# Patient Record
Sex: Female | Born: 1965 | Hispanic: Yes | Marital: Married | Smoking: Never smoker
Health system: Southern US, Community
[De-identification: ages and names within clinical notes are randomized; demographics above are authoritative.]

## PROBLEM LIST (undated history)

## (undated) DIAGNOSIS — R51 Headache: Secondary | ICD-10-CM

## (undated) DIAGNOSIS — R519 Headache, unspecified: Secondary | ICD-10-CM

## (undated) DIAGNOSIS — D649 Anemia, unspecified: Secondary | ICD-10-CM

## (undated) DIAGNOSIS — E119 Type 2 diabetes mellitus without complications: Secondary | ICD-10-CM

## (undated) HISTORY — PX: CHOLECYSTECTOMY: SHX55

## (undated) HISTORY — PX: TUBAL LIGATION: SHX77

---

## 2005-06-13 ENCOUNTER — Ambulatory Visit: Payer: Self-pay

## 2005-06-20 ENCOUNTER — Ambulatory Visit: Payer: Self-pay

## 2007-06-30 ENCOUNTER — Ambulatory Visit: Payer: Self-pay

## 2007-09-30 ENCOUNTER — Ambulatory Visit: Payer: Self-pay

## 2008-07-31 ENCOUNTER — Emergency Department: Payer: Self-pay | Admitting: Emergency Medicine

## 2010-10-14 HISTORY — PX: EYELID LACERATION REPAIR: SHX1564

## 2014-12-27 ENCOUNTER — Ambulatory Visit: Payer: Self-pay | Admitting: Physician Assistant

## 2015-02-09 LAB — CBC WITH DIFFERENTIAL/PLATELET
BASOS PCT: 0.3 %
Basophil #: 0 10*3/uL (ref 0.0–0.1)
Eosinophil #: 0.2 10*3/uL (ref 0.0–0.7)
Eosinophil %: 2.4 %
HCT: 41 % (ref 35.0–47.0)
HGB: 13.6 g/dL (ref 12.0–16.0)
LYMPHS PCT: 27.4 %
Lymphocyte #: 2.3 10*3/uL (ref 1.0–3.6)
MCH: 28.9 pg (ref 26.0–34.0)
MCHC: 33.3 g/dL (ref 32.0–36.0)
MCV: 87 fL (ref 80–100)
MONOS PCT: 7.5 %
Monocyte #: 0.6 x10 3/mm (ref 0.2–0.9)
Neutrophil #: 5.2 10*3/uL (ref 1.4–6.5)
Neutrophil %: 62.4 %
Platelet: 259 10*3/uL (ref 150–440)
RBC: 4.72 10*6/uL (ref 3.80–5.20)
RDW: 12.9 % (ref 11.5–14.5)
WBC: 8.3 10*3/uL (ref 3.6–11.0)

## 2015-02-10 ENCOUNTER — Other Ambulatory Visit: Payer: PRIVATE HEALTH INSURANCE

## 2015-02-10 LAB — COMPREHENSIVE METABOLIC PANEL
ALK PHOS: 72 U/L
ALT: 73 U/L — AB
ANION GAP: 7 (ref 7–16)
Albumin: 4.4 g/dL
BUN: 13 mg/dL
Bilirubin,Total: 0.4 mg/dL
Calcium, Total: 9.5 mg/dL
Chloride: 105 mmol/L
Co2: 28 mmol/L
Creatinine: 0.66 mg/dL
EGFR (African American): >60
EGFR (Non-African Amer.): >60
Glucose: 166 mg/dL — ABNORMAL HIGH
POTASSIUM: 3.8 mmol/L
SGOT(AST): 47 U/L — ABNORMAL HIGH
Sodium: 140 mmol/L
TOTAL PROTEIN: 7.9 g/dL

## 2015-02-10 LAB — TROPONIN I

## 2015-02-10 LAB — URINALYSIS, COMPLETE
BILIRUBIN, UR: NEGATIVE
BLOOD: NEGATIVE
Glucose,UR: NEGATIVE mg/dL (ref 0–75)
Ketone: NEGATIVE
NITRITE: NEGATIVE
PROTEIN: NEGATIVE
Ph: 7 (ref 4.5–8.0)
Specific Gravity: 1.013 (ref 1.003–1.030)

## 2015-02-10 LAB — HEMOGLOBIN A1C: Hemoglobin A1C: 7.1 % — ABNORMAL HIGH

## 2015-02-10 LAB — LIPASE, BLOOD: Lipase: 39 U/L

## 2015-02-11 ENCOUNTER — Observation Stay
Admission: RE | Admit: 2015-02-11 | Discharge: 2015-02-11 | Disposition: A | Discharge status: Home / Self Care | Payer: PRIVATE HEALTH INSURANCE | Attending: Surgery | Admitting: Surgery

## 2015-02-11 DIAGNOSIS — K821 Hydrops of gallbladder: Secondary | ICD-10-CM | POA: Diagnosis present

## 2015-02-11 MED ORDER — PROMETHAZINE HCL 25 MG/ML IJ SOLN: 12.5000 mg | INTRAMUSCULAR | Status: DC | PRN

## 2015-02-11 MED ORDER — SODIUM CHLORIDE 0.9 % IJ SOLN: 3.0000 mL | INTRAMUSCULAR | Status: DC | PRN

## 2015-02-11 MED ORDER — ONDANSETRON HCL 4 MG/2ML IJ SOLN: 4.0000 mg | INTRAMUSCULAR | Status: DC | PRN

## 2015-02-11 MED ORDER — MORPHINE SULFATE 2 MG/ML IJ SOLN: 2.0000 mg | INTRAMUSCULAR | Status: DC | PRN

## 2015-02-11 MED ORDER — LACTATED RINGERS IV SOLN
INTRAVENOUS | Status: DC
  Filled 2015-02-11: qty 250

## 2015-02-11 MED ORDER — HYDROCODONE-ACETAMINOPHEN 5-325 MG PO TABS: 1.0000 | ORAL_TABLET | ORAL | Status: DC | PRN

## 2015-02-11 MED ORDER — INSULIN ASPART 100 UNIT/ML ~~LOC~~ SOLN: 0.0000 [IU] | Freq: Three times a day (TID) | SUBCUTANEOUS | Status: DC

## 2015-02-12 NOTE — Op Note (Addendum)
PATIENT NAME:  Sheryl Medina, Sheryl Medina MR#:  [id number] DATE OF BIRTH:  [birth year]  DATE OF PROCEDURE:  02/10/2015  PREOPERATIVE DIAGNOSIS: Acute calculous cholecystitis.   POSTOPERATIVE DIAGNOSIS:   Acute calculous cholecystitis.   PROCEDURE PERFORMED: Laparoscopic cholecystectomy.   SURGEON: Hortencia Conradi, MD   ASSISTANT: None.   ANESTHESIA: General endotracheal.   FINDINGS: Stones lodged in gallbladder neck.   SPECIMENS: Gallbladder with contents to pathology.   ESTIMATED BLOOD LOSS: 25 mL.   DRAINS: None.   COUNTS: Lap and needle count correct x2.   DESCRIPTION OF PROCEDURE: With informed consent in supine position, general endotracheal anesthesia, the patient's abdomen was widely clipped of hair, sterilely prepped and draped with ChloraPrep solution. A timeout was observed. The previous infraumbilical transverse oriented scar was opened with a scalpel and carried down with sharp dissection to the fascia which was incised in the midline and elevated with Kocher clamps. The peritoneum was entered sharply; 0 Vicryl U-stitch was passed on either side of the abdominal midline fascia securing a 12 mm blunt Hassan trocar placed under direct visualization. Pneumoperitoneum was established. The patient was then positioned in reverse Trendelenburg and airplaned right side up. A 5 mm bladeless trocar was placed in the epigastric region, two 5 mm first assistant ports in the right subcostal margin. The gallbladder was tensely distended and aspirated of 40 mL of thin bile with aspiration cannula. It was placed on tension over the liver. Lateral retraction was achieved on Hartmann's pouch. Dissection of the hepatoduodenal ligament utilizing blunt technique and minimal cautery demonstrated a single cystic duct and a cystic artery with a posterior and anterior branch. One small intervening lymphatic vessel was divided between single hemoclips. The cystic duct was triply clipped on the portal side,  singly clipped on the gallbladder side and divided. The cystic artery was divided with its anterior and posterior branches separately. The gallbladder was then retrieved off the gallbladder fossa utilizing hook cautery apparatus, placed into an EndoCatch device and retrieved. Pneumoperitoneum was re-established. Visualization of the umbilical trocar site demonstrated no evidence of visceral injury. Ports were then removed under direct visualization after the right upper quadrant was irrigated with a total of 1.5 liters of warm normal saline and aspirated dry and with hemostasis being obtained in the gallbladder fossa with hook cautery apparatus pneumoperitoneum was then released. The infraumbilical fascial defect was reapproximated with an additional figure-of-eight #0 Vicryl suture in vertical orientation, the existing stay sutures tied to each other. A total of 25 mL of 0.25% plain Marcaine was infiltrated along all skin and fascial incisions prior to closure. Vicryl 4-0 subcuticular was applied to all skin edges followed by benzoin, Steri-Strips, Telfa, and Tegaderm. The patient was then subsequently extubated and taken to the recovery room in stable and satisfactory condition by anesthesia services.    ____________________________ Jeannette How Marina Gravel, MD mab:tr D: 02/10/2015 11:53:30 ET T: 02/10/2015 15:58:12 ET JOB#: [id number]  cc: Elta Guadeloupe A. Marina Gravel, MD, <Dictator> Hortencia Conradi MD ELECTRONICALLY SIGNED 02/11/2015 15:26

## 2015-02-12 NOTE — Consult Note (Signed)
Brief Consult Note: Diagnosis: 1. Hyperglycemia 2. s/p Lap Chole.   Patient was seen by consultant.   Consult note dictated.   Orders entered.   Discussed with Attending MD.   Comments: 49 yo female w/ hx of DM (diet controlled), who came to the hospital due to abdominal pain and suspected acute cholecystitis and is s/p Lap Chole and noted to be hyperglycemic.   1. Hyperglycemia - pt. does have hx of DM but it's diet controlled.  - A1c is mildly elevated at 7.1.  ?? glucose intolerance (vs) DM - cont. SSI for now.  Advised pt. to check sugars 3 times a day and go back to PCP to see if she needs to be on oral meds down the road.  - cont. carb control diet for now.   2. S/p Lap Chole - cont. pain control and care as per surgery.   Thanks for the consult and will sign off and call back if any further help needed.   Job # [id number].  Electronic Signatures: Henreitta Leber (MD)  (Signed 29-Apr-16 14:50)  Authored: Brief Consult Note   Last Updated: 29-Apr-16 14:50 by Henreitta Leber (MD)

## 2015-02-12 NOTE — H&P (Signed)
   Subjective/Chief Complaint Epigastric pain, nausea/vomiting   History of Present Illness 49 yo F who presents with 10 hours of epigastric pain and 8 hours of nausea/vomiting.  Pain began acutely and has worsened.  Not currently nauseated.  Has felt this pain approx 19 years ago.  No fevers/chills, night sweats, chest pain, diarrhea/constipation, shortness of breath, dysuria/hematuria.  No sick contacts, no unusual ingestions.   Past History H/o tubal ligation   Code Status Full Code   Past Med/Surgical Hx:  tubal ligation:   ALLERGIES:  No Known Allergies:   Family and Social History:  Family History Coronary Artery Disease  Kidney disease   Social History negative tobacco, negative ETOH, negative Illicit drugs   Review of Systems:  Subjective/Chief Complaint Complete review of systems taken.  Pertinent positives and negatives as above.   Physical Exam:  GEN well developed, well nourished, no acute distress   HEENT pink conjunctivae, PERRL, hearing intact to voice, good dentition   RESP normal resp effort  clear BS  no use of accessory muscles   CARD regular rate  no thrills   ABD denies tenderness  denies Flank Tenderness  no hernia  soft  normal BS   EXTR negative cyanosis/clubbing, negative edema   SKIN normal to palpation, No rashes, No ulcers   NEURO cranial nerves intact, negative rigidity, negative tremor, follows commands, strength:, motor/sensory function intact   PSYCH A+O to time, place, person, good insight    Assessment/Admission Diagnosis 49 yo who presents with epigastric pain/N/V.  Improved but still there.  U/S shows nonmobile stones in neck of gallbladder.  Favor gallbladder hydrops.   Plan Admit, NPO, IVF, IV antibiotics.  OR for laparoscopic cholecystectomy but final decision pending on Dr. Marina Gravel, daytime surgeon.   Electronic Signatures: Floyde Parkins (MD)  (Signed 29-Apr-16 03:31)  Authored: CHIEF COMPLAINT and HISTORY, PAST  MEDICAL/SURGIAL HISTORY, ALLERGIES, FAMILY AND SOCIAL HISTORY, REVIEW OF SYSTEMS, PHYSICAL EXAM, ASSESSMENT AND PLAN   Last Updated: 29-Apr-16 03:31 by Floyde Parkins (MD)

## 2015-02-12 NOTE — Consult Note (Signed)
PATIENT NAME:  Sheryl Medina, Sheryl Medina MR#:  [id number] DATE OF BIRTH:  1966-06-16  DATE OF CONSULTATION:  02/10/2015  REFERRING PHYSICIAN:  Elta Guadeloupe A. Marina Gravel, MD    CONSULTING PHYSICIAN:  Belia Heman. Verdell Carmine, MD  PRIMARY CARE PHYSICIAN: At the [facility].    REASON FOR CONSULTATION: Hyperglycemia.   HISTORY OF PRESENT ILLNESS: This is a 49 year old female who presented to the hospital complaining of abdominal pain and noted to have suspected acute cholecystitis. The patient is status post lap laparoscopic cholecystectomy today, but was noted to have blood sugars that were mildly elevated. Hospitalist services were contacted for further treatment and evaluation. The patient was diagnosed with diabetes in February of this year, but is being controlled just through diet at this point. She cannot recall her hemoglobin A1c when she was diagnosed. Presently, she is not having any polyuria, nocturia, or increased thirst. Hospitalist services were contacted for further management.   REVIEW OF SYSTEMS. : CONSTITUTIONAL: No documented fever. No weight gain, no weight loss.  EYES: No blurred or double vision.  EARS, NOSE, AND THROAT: No tinnitus. No postnasal drip. No redness of the oropharynx.  RESPIRATORY: No cough, no wheeze, no hemoptysis, no dyspnea.  CARDIOVASCULAR: No chest pain, no orthopnea, no palpitations, no syncope.  GASTROINTESTINAL: No nausea, no vomiting. Positive abdominal pain. No melena or hematochezia.  GENITOURINARY: No dysuria or hematuria.  ENDOCRINE: No polyuria, nocturia, heat or cold intolerance.  HEMATOLOGIC: No anemia, no bruising, no bleeding.  INTEGUMENTARY: No rashes. No lesions.  MUSCULOSKELETAL: No arthritis, no swelling, no gout.  NEUROLOGIC: No numbness, tingling, or ataxia. No seizure-type activity.  PSYCHIATRIC: No anxiety, no insomnia. No ADD.   PAST MEDICAL HISTORY: Consistent with diabetes, diet controlled.   ALLERGIES: No known drug allergies.    SOCIAL HISTORY: No smoking. No alcohol abuse. No illicit drug abuse. Lives by herself.   FAMILY HISTORY: The patient's mother is deceased, she had diabetes. Father is alive, has hypertension but no other medical problems.   CURRENT MEDICATIONS: The patient is currently on no medications.   PHYSICAL EXAMINATION: Presently is as follows:  VITAL SIGNS: Temperature 97.6, pulse 62, respirations 16, blood pressure 102/64, saturations 94% on 2 liters nasal cannula.  GENERAL: She is a pleasant-appearing female in no apparent distress.  HEAD, EYES, EARS, NOSE AND THROAT: Atraumatic, normocephalic. Her extraocular muscles are intact. Pupils round, reactive to light. Sclerae anicteric. No conjunctival injection or pharyngeal erythema.  NECK: Supple. There is no jugular venous distention. No bruits. No lymphadenopathy. No thyromegaly.  HEART: Regular rate and rhythm. No murmurs. No rubs. No clicks.  LUNGS: Clear to auscultation bilaterally. No rales or rhonchi. No wheezes.  ABDOMEN: Soft, flat, tender, but no rebound, no rigidity. Hypoactive bowel sounds. No hepatosplenomegaly appreciated.  EXTREMITIES: No evidence of any cyanosis, clubbing, or peripheral edema. Has +2 pedal and radial pulses bilaterally.  NEUROLOGIC: She is alert, awake and oriented x 3, with no focal motor or sensory deficits appreciated bilaterally.  SKIN: Moist and warm with no rashes.  LYMPHATIC: There is no cervical or axillary lymphadenopathy.   LABORATORY DATA: Serum glucose of 166, repeated at 128; BUN 13, creatinine 0.6, sodium 140, potassium 3.8, chloride 105, bicarbonate 28, AST 47, ALT of 73. Troponin less than 0.03. Hemoglobin A1c 7.1. White cell count 8.3, hemoglobin 13.6, hematocrit 41.0, platelet count 59,000. Urinalysis within normal limits.   ASSESSMENT AND PLAN: This is a 49 year old female with history of diabetes, diet controlled, who presented to the hospital due to  abdominal pain and suspected acute cholecystitis  and is status post laparoscopic cholecystectomy and noted to be mildly hypoglycemic.  1. Hyperglycemia. The patient does have a history of diabetes, but it is diet controlled. Her hemoglobin A1c is only mildly elevated at 7.1. Whether this is true diabetes or glucose  intolerance is unclear, but further needs to be followed. I advised that she check her blood sugars at home, at least 2 or 3 times a day and take it to her primary care physician. I would continue sliding scale insulin for now. She likely may need to be on oral therapy down the road, but this can be further addressed through her primary care physician. I would continue carbohydrate-controlled diet here and also at home, for now.  2. Status post laparoscopic cholecystectomy. Continue pain control and care as per surgery.   Thank you so much for the consultation. We will sign off. Discussed the case with Dr. Marina Gravel. Call back if any further help needed,   TIME SPENT: 45 minutes     ____________________________ Belia Heman. Verdell Carmine, MD vjs:mw D: 02/10/2015 14:50:43 ET T: 02/10/2015 15:11:16 ET JOB#: [id number]  cc: Belia Heman. Verdell Carmine, MD, <Dictator> Henreitta Leber MD ELECTRONICALLY SIGNED 02/10/2015 16:19

## 2015-03-06 LAB — SURGICAL PATHOLOGY

## 2015-11-15 ENCOUNTER — Other Ambulatory Visit: Payer: Self-pay | Admitting: Internal Medicine

## 2015-11-15 DIAGNOSIS — R748 Abnormal levels of other serum enzymes: Secondary | ICD-10-CM

## 2015-11-22 ENCOUNTER — Ambulatory Visit: Payer: PRIVATE HEALTH INSURANCE

## 2015-11-22 ENCOUNTER — Ambulatory Visit
Admission: RE | Admit: 2015-11-22 | Discharge: 2015-11-22 | Disposition: A | Discharge status: Home / Self Care | Payer: PRIVATE HEALTH INSURANCE | Source: Ambulatory Visit | Attending: Internal Medicine | Admitting: Internal Medicine

## 2015-11-22 DIAGNOSIS — K760 Fatty (change of) liver, not elsewhere classified: Secondary | ICD-10-CM | POA: Insufficient documentation

## 2015-11-22 DIAGNOSIS — R748 Abnormal levels of other serum enzymes: Secondary | ICD-10-CM | POA: Diagnosis not present

## 2015-11-23 ENCOUNTER — Other Ambulatory Visit: Payer: Self-pay | Admitting: Internal Medicine

## 2015-11-23 DIAGNOSIS — Z1231 Encounter for screening mammogram for malignant neoplasm of breast: Secondary | ICD-10-CM

## 2015-12-28 ENCOUNTER — Ambulatory Visit
Admission: RE | Admit: 2015-12-28 | Discharge: 2015-12-28 | Disposition: A | Discharge status: Home / Self Care | Payer: PRIVATE HEALTH INSURANCE | Source: Ambulatory Visit | Attending: Internal Medicine | Admitting: Internal Medicine

## 2015-12-28 DIAGNOSIS — Z1231 Encounter for screening mammogram for malignant neoplasm of breast: Secondary | ICD-10-CM | POA: Insufficient documentation

## 2016-05-31 IMAGING — MG MM DIGITAL SCREENING BILATERAL
4 series · 4 of 4 positions shown · non-contrast
Comparison: Previous exam(s).

CLINICAL DATA: Screening.

EXAM:
DIGITAL SCREENING BILATERAL MAMMOGRAM WITH CAD

[L CC]
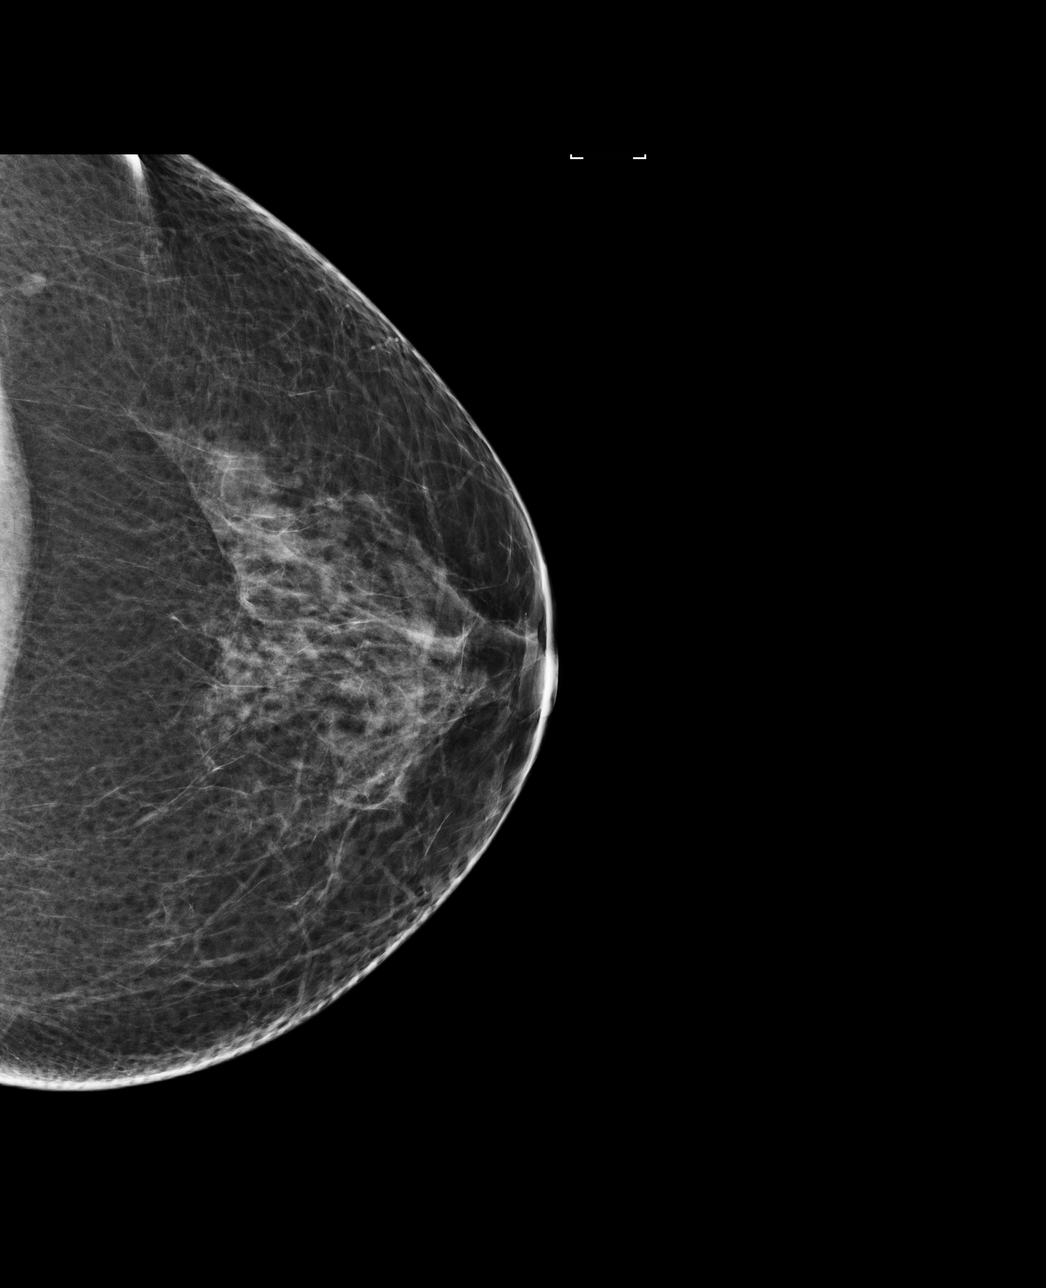

[R CC]
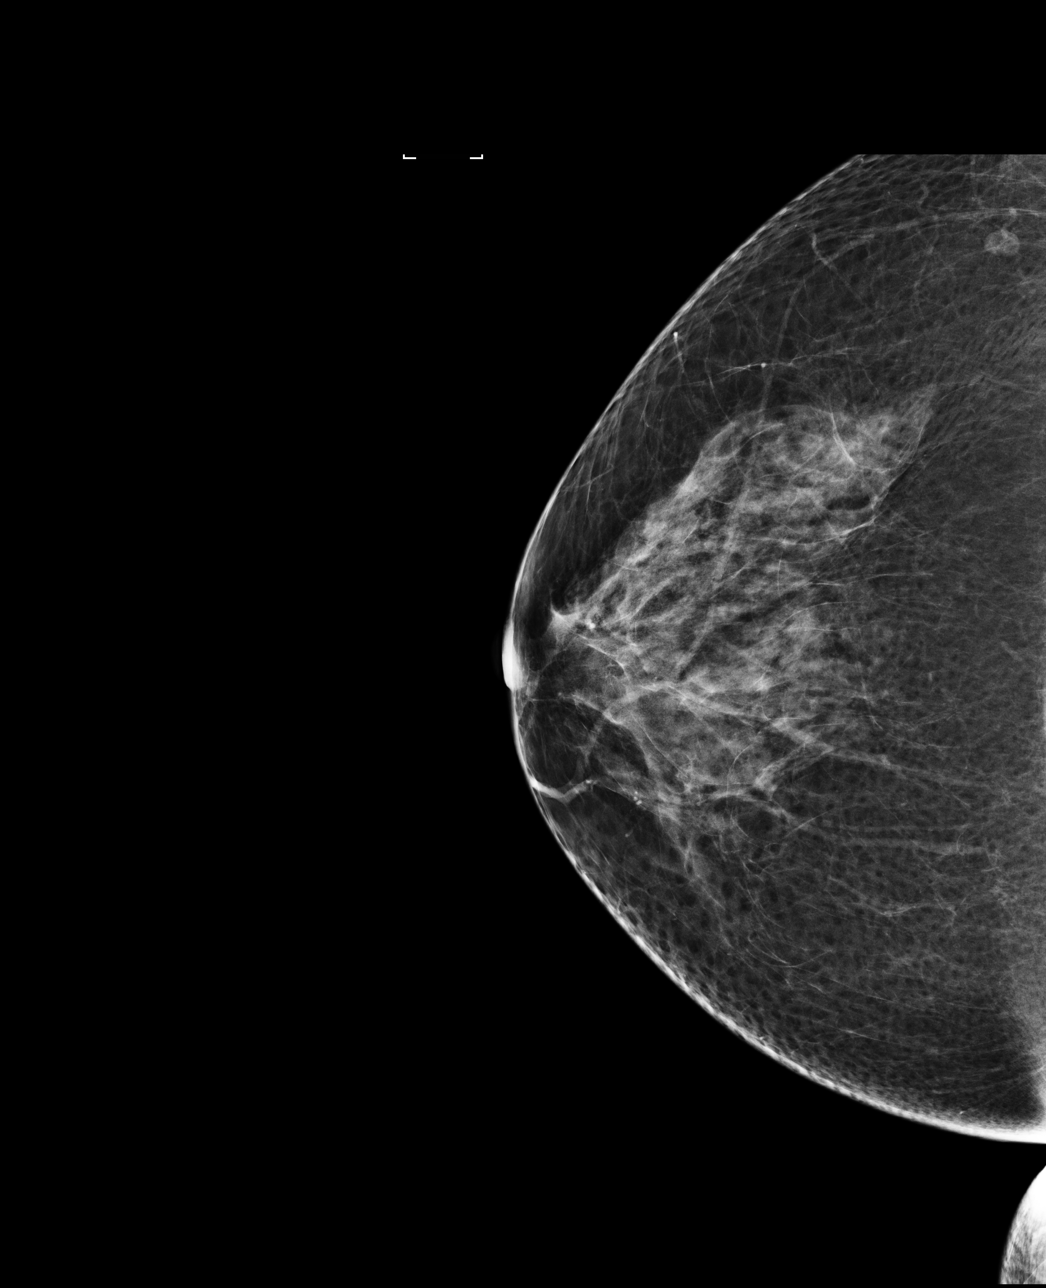

[L MLO]
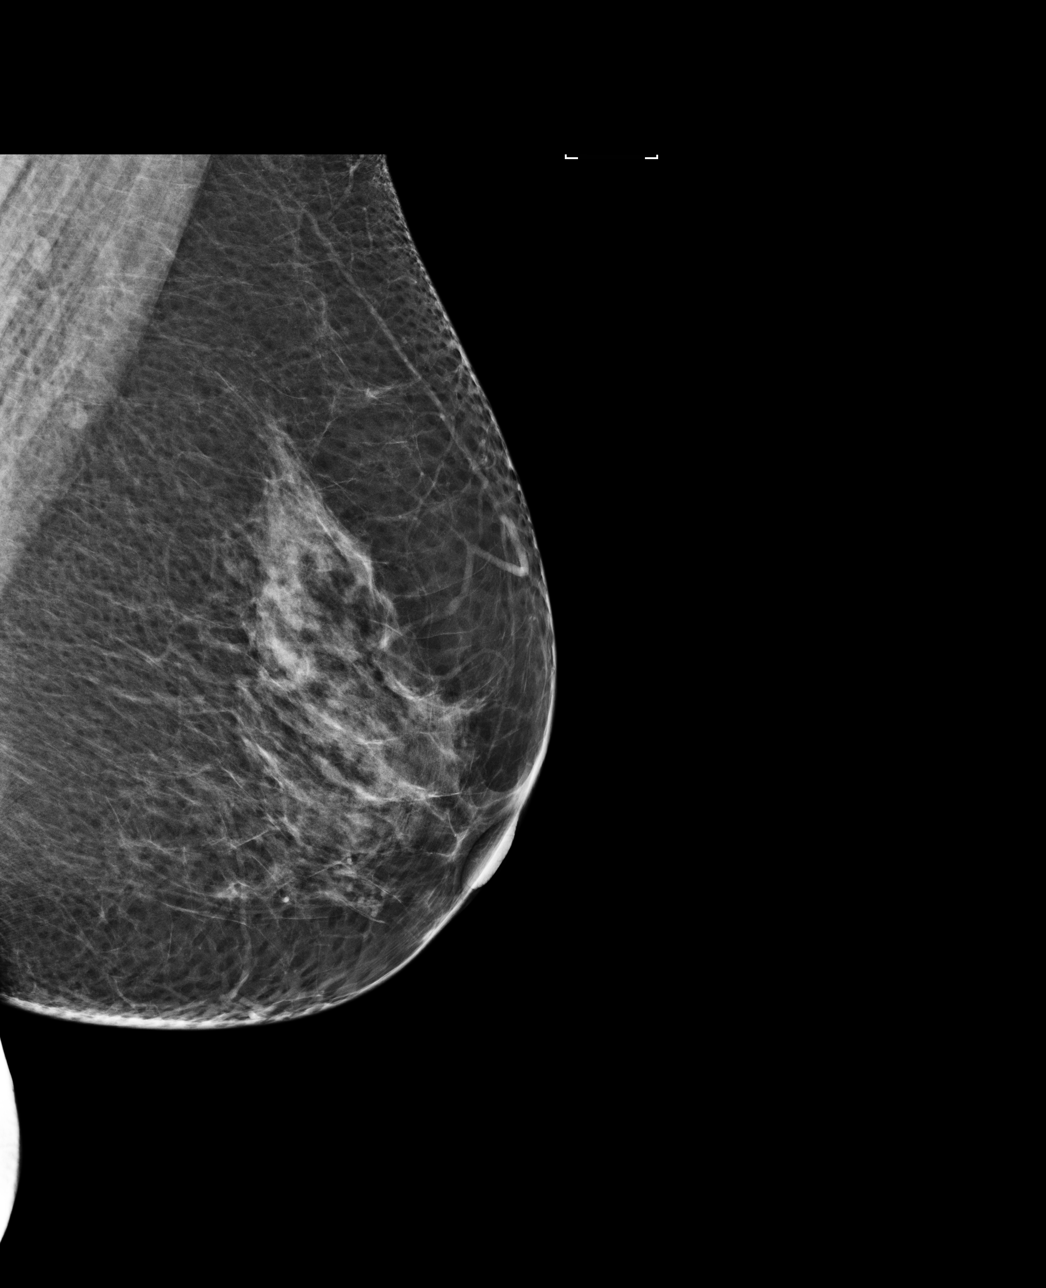

[R MLO]
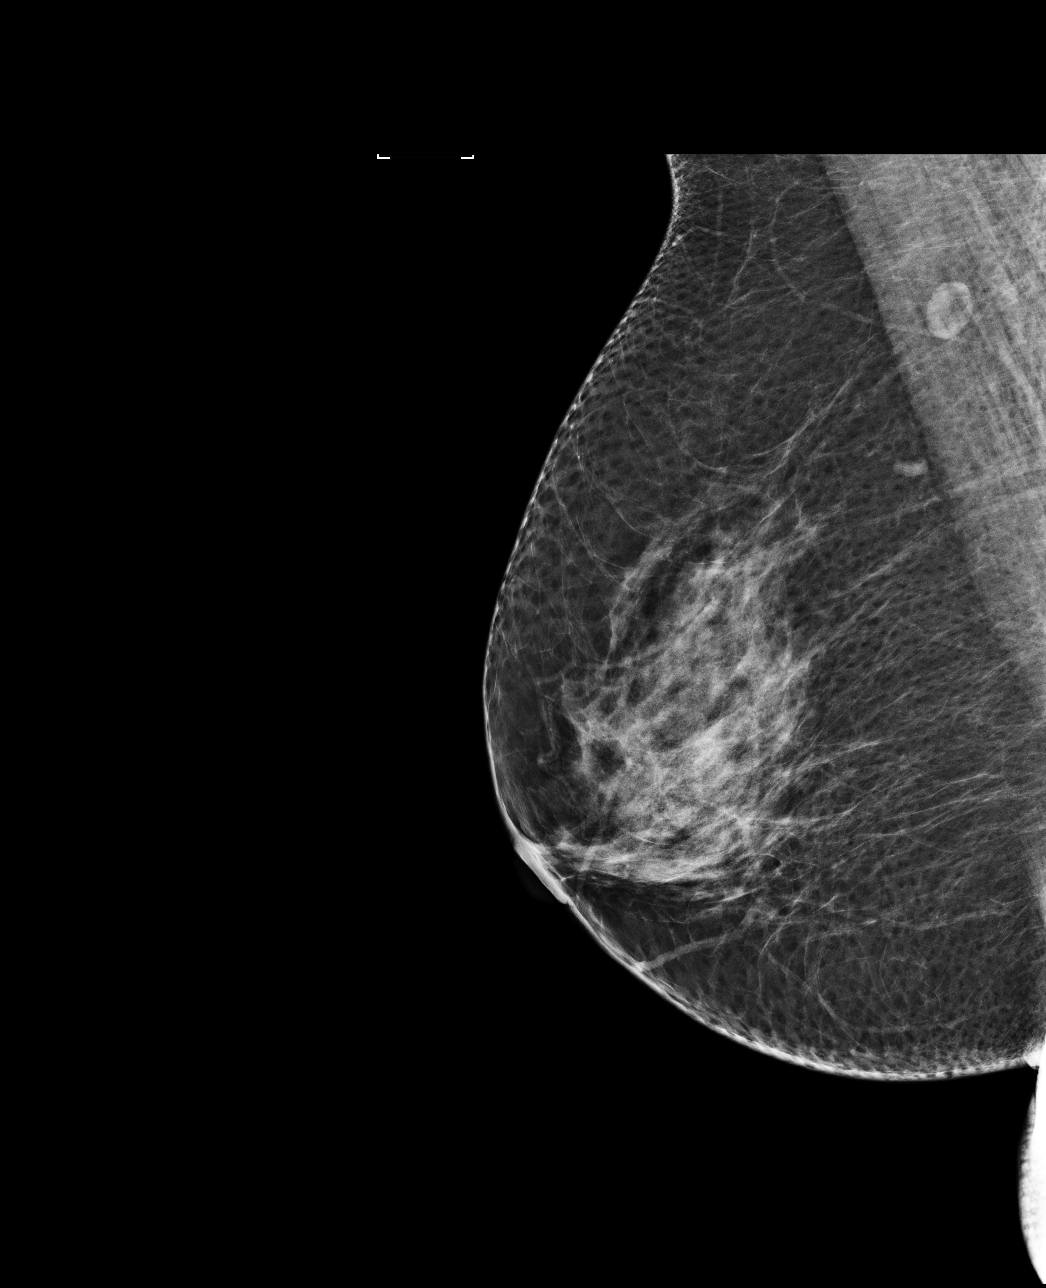

[4 of 4 positions shown; findings below may reference images not displayed]

ACR Breast Density Category b: There are scattered areas of
fibroglandular density.
FINDINGS: There are no findings suspicious for malignancy. Images were
processed with CAD.
IMPRESSION: No mammographic evidence of malignancy. A result letter of this
screening mammogram will be mailed directly to the patient.

RECOMMENDATION:
Screening mammogram in one year. (Code:AS-G-LCT)

BI-RADS CATEGORY  1: Negative.

## 2017-06-06 ENCOUNTER — Other Ambulatory Visit: Payer: Self-pay | Admitting: Internal Medicine

## 2017-06-06 ENCOUNTER — Encounter
Admission: RE | Admit: 2017-06-06 | Discharge: 2017-06-06 | Disposition: A | Discharge status: Home / Self Care | Payer: PRIVATE HEALTH INSURANCE | Source: Ambulatory Visit | Attending: Surgery | Admitting: Surgery

## 2017-06-06 ENCOUNTER — Ambulatory Visit
Admission: RE | Admit: 2017-06-06 | Discharge: 2017-06-06 | Disposition: A | Discharge status: Home / Self Care | Payer: PRIVATE HEALTH INSURANCE | Source: Ambulatory Visit | Attending: Internal Medicine | Admitting: Internal Medicine

## 2017-06-06 DIAGNOSIS — R1012 Left upper quadrant pain: Secondary | ICD-10-CM

## 2017-06-06 DIAGNOSIS — E119 Type 2 diabetes mellitus without complications: Secondary | ICD-10-CM | POA: Diagnosis not present

## 2017-06-06 DIAGNOSIS — R001 Bradycardia, unspecified: Secondary | ICD-10-CM | POA: Diagnosis not present

## 2017-06-06 HISTORY — DX: Anemia, unspecified: D64.9

## 2017-06-06 HISTORY — DX: Headache: R51

## 2017-06-06 HISTORY — DX: Type 2 diabetes mellitus without complications: E11.9

## 2017-06-06 HISTORY — DX: Headache, unspecified: R51.9

## 2017-06-06 LAB — CBC
HCT: 40.4 % (ref 35.0–47.0)
Hemoglobin: 14 g/dL (ref 12.0–16.0)
MCH: 29.8 pg (ref 26.0–34.0)
MCHC: 34.6 g/dL (ref 32.0–36.0)
MCV: 86.2 fL (ref 80.0–100.0)
PLATELETS: 271 10*3/uL (ref 150–440)
RBC: 4.69 MIL/uL (ref 3.80–5.20)
RDW: 12.7 % (ref 11.5–14.5)
WBC: 5.1 10*3/uL (ref 3.6–11.0)

## 2017-06-06 LAB — COMPREHENSIVE METABOLIC PANEL
ALK PHOS: 63 U/L (ref 38–126)
ALT: 25 U/L (ref 14–54)
AST: 21 U/L (ref 15–41)
Albumin: 4.2 g/dL (ref 3.5–5.0)
Anion gap: 7 (ref 5–15)
BUN: 11 mg/dL (ref 6–20)
CALCIUM: 9.4 mg/dL (ref 8.9–10.3)
CO2: 29 mmol/L (ref 22–32)
CREATININE: 0.58 mg/dL (ref 0.44–1.00)
Chloride: 107 mmol/L (ref 101–111)
Glucose, Bld: 107 mg/dL — ABNORMAL HIGH (ref 65–99)
Potassium: 4.4 mmol/L (ref 3.5–5.1)
Sodium: 143 mmol/L (ref 135–145)
Total Bilirubin: 0.4 mg/dL (ref 0.3–1.2)
Total Protein: 7.7 g/dL (ref 6.5–8.1)

## 2017-06-06 MED ORDER — IOPAMIDOL (ISOVUE-300) INJECTION 61%
100.0000 mL | Freq: Once | INTRAVENOUS | Status: AC | PRN
  Administered 2017-06-06: 100 mL via INTRAVENOUS

## 2017-06-06 MED ORDER — LACTATED RINGERS IV SOLN
INTRAVENOUS | Status: DC
  Filled 2017-06-06: qty 1000

## 2017-06-06 NOTE — Patient Instructions (Signed)
Your procedure is scheduled on: @ADMITDT2 @ June 13, 2017 Su procedimiento est programado para: Report to  Presntese a: [place] To find out your arrival time please call [phone] between [place] on  Para saber su hora de llegada por favor llame al ([place]: June 12, 2017  Remember: Instructions that are not followed completely may result in serious medical risk, up to and including death, or upon the discretion of your surgeon and anesthesiologist your surgery may need to be rescheduled.  Recuerde: Las instrucciones que no se siguen completamente Heritage manager en un riesgo de salud grave, incluyendo hasta la [place] o a discrecin de su cirujano y Environmental health practitioner, su ciruga se puede posponer.   __X__ 1. Do not eat food or drink liquids after midnight. No gum chewing or hard candies.  No coma alimentos ni tome lquidos despus de la medianoche.  No mastique chicle ni caramelos  duros.     __X__ 2. No alcohol for 24 hours before or after surgery.    No tome alcohol durante las 24 horas antes ni despus de la [place].   __X__ 3. Bring all medications with you on the day of surgery if instructed.    Lleve todos los medicamentos con usted el da de su ciruga si se le ha indicado as.    __X__ 4. Notify your doctor if there is any change in your medical condition (cold, fever,                             infections).    Informe a su mdico si hay algn cambio en su condicin mdica (resfriado, fiebre, infecciones).   Do not wear jewelry, make-up, hairpins, clips or nail polish.  No use joyas, maquillajes, pinzas/ganchos para el cabello ni esmalte de uas.  Do not wear lotions, powders, or perfumes. You may wear deodorant.  No use lociones, polvos o perfumes.  Puede usar desodorante.    Do not shave 48 hours prior to surgery. Men may shave face and neck.  No se afeite 48 horas antes de la [place].  Los hombres pueden [employer] cara  y el cuello.   Do not bring valuables to the hospital.   No lleve objetos [place] is not responsible for any belongings or valuables.   no se hace responsable de ningn tipo de pertenencias u objetos de Geographical information systems officer.               Contacts, dentures or bridgework may not be worn into surgery.  Los lentes de [place], las dentaduras postizas o puentes no se pueden usar en la [place].  Leave your suitcase in the car. After surgery it may be brought to your room.  Deje su maleta en el auto.  Despus de la ciruga podr traerla a su habitacin.  For patients admitted to the hospital, discharge time is determined by your treatment team.  Para los pacientes que sean ingresados al hospital, el tiempo en el cual se le dar de alta es determinado por su                equipo de [place].   Patients discharged the day of surgery will not be allowed to drive home. A los pacientes que se les da de alta el mismo da de la ciruga no se les permitir conducir a Holiday representative.  Please read over the following fact sheets that you were given: Por favor [place] informacin que le dieron:   CHG INSTRUCTIONS   ____ Take these medicines the morning of surgery with A SIP OF WATER:          [employer] medicinas la maana de la ciruga con UN SORBO DE AGUA:  1.NONE  2.   3.   4.       5.  6.    __X__ Use CHG Soap as directed          Utilice el jabn de CHG segn lo indicado  __X__ Stop metformin 2 days prior to surgery - June 10, 2017          Deje de tomar el metformin 2 das antes de la [place]    __X__ Stop Anti-inflammatories on           Deje de tomar antiinflamatorios el da: June 06, 2017- MELOXICAM, IBUPROFEN , ALEVE , MOTRIN, ADVIL, GOODY'S POWDERS   __X__ Stop supplements until after surgery            Deje de tomar suplementos hasta despus de la ciruga - TURMERIC

## 2017-06-06 NOTE — Pre-Procedure Instructions (Signed)
OTTO INTERPRETER WITH PT FOR pat VISIT

## 2017-06-13 ENCOUNTER — Ambulatory Visit
Admission: RE | Admit: 2017-06-13 | Discharge: 2017-06-13 | Disposition: A | Discharge status: Home / Self Care | Payer: PRIVATE HEALTH INSURANCE | Source: Ambulatory Visit | Attending: Surgery | Admitting: Surgery

## 2017-06-13 ENCOUNTER — Ambulatory Visit: Payer: PRIVATE HEALTH INSURANCE | Admitting: Anesthesiology

## 2017-06-13 ENCOUNTER — Encounter: Payer: Self-pay | Admitting: Anesthesiology

## 2017-06-13 ENCOUNTER — Encounter
Admission: RE | Disposition: A | Discharge status: Home / Self Care | Payer: Self-pay | Source: Ambulatory Visit | Attending: Surgery

## 2017-06-13 DIAGNOSIS — D171 Benign lipomatous neoplasm of skin and subcutaneous tissue of trunk: Secondary | ICD-10-CM | POA: Diagnosis not present

## 2017-06-13 DIAGNOSIS — E119 Type 2 diabetes mellitus without complications: Secondary | ICD-10-CM | POA: Diagnosis not present

## 2017-06-13 DIAGNOSIS — Z7984 Long term (current) use of oral hypoglycemic drugs: Secondary | ICD-10-CM | POA: Diagnosis not present

## 2017-06-13 DIAGNOSIS — R222 Localized swelling, mass and lump, trunk: Secondary | ICD-10-CM | POA: Diagnosis present

## 2017-06-13 DIAGNOSIS — Z79899 Other long term (current) drug therapy: Secondary | ICD-10-CM | POA: Insufficient documentation

## 2017-06-13 HISTORY — PX: LIPOMA EXCISION: SHX5283

## 2017-06-13 LAB — GLUCOSE, CAPILLARY
Glucose-Capillary: 121 mg/dL — ABNORMAL HIGH (ref 65–99)
Glucose-Capillary: 163 mg/dL — ABNORMAL HIGH (ref 65–99)

## 2017-06-13 SURGERY — EXCISION LIPOMA
Anesthesia: General | Wound class: Clean

## 2017-06-13 MED ORDER — FENTANYL CITRATE (PF) 100 MCG/2ML IJ SOLN
INTRAMUSCULAR | Status: AC
  Filled 2017-06-13: qty 2

## 2017-06-13 MED ORDER — LIDOCAINE HCL (PF) 2 % IJ SOLN
INTRAMUSCULAR | Status: AC
  Filled 2017-06-13: qty 6

## 2017-06-13 MED ORDER — LIDOCAINE HCL (PF) 1 % IJ SOLN
INTRAMUSCULAR | Status: AC
  Filled 2017-06-13: qty 30

## 2017-06-13 MED ORDER — KETOROLAC TROMETHAMINE 30 MG/ML IJ SOLN
INTRAMUSCULAR | Status: AC
  Filled 2017-06-13: qty 1

## 2017-06-13 MED ORDER — LACTATED RINGERS IV SOLN
INTRAVENOUS | Status: DC | PRN
  Administered 2017-06-13: 08:00:00 via INTRAVENOUS

## 2017-06-13 MED ORDER — MIDAZOLAM HCL 2 MG/2ML IJ SOLN
INTRAMUSCULAR | Status: AC
  Filled 2017-06-13: qty 2

## 2017-06-13 MED ORDER — HYDROCODONE-ACETAMINOPHEN 5-325 MG PO TABS: 1.0000 | ORAL_TABLET | ORAL | Status: DC | PRN

## 2017-06-13 MED ORDER — PROPOFOL 10 MG/ML IV BOLUS
INTRAVENOUS | Status: AC
  Filled 2017-06-13: qty 60

## 2017-06-13 MED ORDER — ONDANSETRON HCL 4 MG/2ML IJ SOLN
INTRAMUSCULAR | Status: AC
  Filled 2017-06-13: qty 2

## 2017-06-13 MED ORDER — MIDAZOLAM HCL 2 MG/2ML IJ SOLN
INTRAMUSCULAR | Status: DC | PRN
  Administered 2017-06-13 (×2): 1 mg via INTRAVENOUS

## 2017-06-13 MED ORDER — ONDANSETRON HCL 4 MG/2ML IJ SOLN
INTRAMUSCULAR | Status: DC | PRN
  Administered 2017-06-13: 4 mg via INTRAVENOUS

## 2017-06-13 MED ORDER — FAMOTIDINE 20 MG PO TABS
ORAL_TABLET | ORAL | Status: AC
  Administered 2017-06-13: 20 mg via ORAL
  Filled 2017-06-13: qty 1

## 2017-06-13 MED ORDER — DEXAMETHASONE SODIUM PHOSPHATE 10 MG/ML IJ SOLN
INTRAMUSCULAR | Status: AC
  Filled 2017-06-13: qty 1

## 2017-06-13 MED ORDER — ONDANSETRON HCL 4 MG/2ML IJ SOLN
4.0000 mg | Freq: Once | INTRAMUSCULAR | Status: AC | PRN
  Administered 2017-06-13: 4 mg via INTRAVENOUS

## 2017-06-13 MED ORDER — PHENYLEPHRINE HCL 10 MG/ML IJ SOLN
INTRAMUSCULAR | Status: AC
  Filled 2017-06-13: qty 1

## 2017-06-13 MED ORDER — BUPIVACAINE HCL (PF) 0.5 % IJ SOLN
INTRAMUSCULAR | Status: AC
  Filled 2017-06-13: qty 30

## 2017-06-13 MED ORDER — FENTANYL CITRATE (PF) 100 MCG/2ML IJ SOLN
INTRAMUSCULAR | Status: DC | PRN
  Administered 2017-06-13: 50 ug via INTRAVENOUS
  Administered 2017-06-13: 25 ug via INTRAVENOUS
  Administered 2017-06-13 (×2): 50 ug via INTRAVENOUS
  Administered 2017-06-13: 25 ug via INTRAVENOUS

## 2017-06-13 MED ORDER — FENTANYL CITRATE (PF) 100 MCG/2ML IJ SOLN: 25.0000 ug | INTRAMUSCULAR | Status: DC | PRN

## 2017-06-13 MED ORDER — SODIUM CHLORIDE 0.9 % IV SOLN
INTRAVENOUS | Status: DC
  Administered 2017-06-13: 50 mL/h via INTRAVENOUS
  Administered 2017-06-13: 10:00:00 via INTRAVENOUS

## 2017-06-13 MED ORDER — HYDROCODONE-ACETAMINOPHEN 5-325 MG PO TABS: 1.0000 | ORAL_TABLET | ORAL | 0 refills | Status: AC | PRN

## 2017-06-13 MED ORDER — FAMOTIDINE 20 MG PO TABS
20.0000 mg | ORAL_TABLET | Freq: Once | ORAL | Status: AC
  Administered 2017-06-13: 20 mg via ORAL

## 2017-06-13 MED ORDER — SUCCINYLCHOLINE CHLORIDE 20 MG/ML IJ SOLN
INTRAMUSCULAR | Status: AC
  Filled 2017-06-13: qty 1

## 2017-06-13 MED ORDER — LIDOCAINE-EPINEPHRINE (PF) 1 %-1:200000 IJ SOLN
INTRAMUSCULAR | Status: AC
  Filled 2017-06-13: qty 30

## 2017-06-13 MED ORDER — LIDOCAINE-EPINEPHRINE (PF) 1 %-1:200000 IJ SOLN
INTRAMUSCULAR | Status: DC | PRN
  Administered 2017-06-13: 25 mL

## 2017-06-13 MED ORDER — EPHEDRINE SULFATE 50 MG/ML IJ SOLN
INTRAMUSCULAR | Status: AC
  Filled 2017-06-13: qty 1

## 2017-06-13 SURGICAL SUPPLY — 26 items
BLADE SURG 15 STRL LF DISP TIS (BLADE) ×1 IMPLANT
BLADE SURG 15 STRL SS (BLADE) ×2
CHLORAPREP W/TINT 26ML (MISCELLANEOUS) ×3 IMPLANT
DERMABOND ADVANCED (GAUZE/BANDAGES/DRESSINGS) ×2
DERMABOND ADVANCED .7 DNX12 (GAUZE/BANDAGES/DRESSINGS) ×1 IMPLANT
DRAPE LAPAROTOMY 100X77 ABD (DRAPES) ×3 IMPLANT
ELECT REM PT RETURN 9FT ADLT (ELECTROSURGICAL) ×3
ELECTRODE REM PT RTRN 9FT ADLT (ELECTROSURGICAL) ×1 IMPLANT
GLOVE BIO SURGEON STRL SZ7.5 (GLOVE) ×3 IMPLANT
GOWN STRL REUS W/ TWL LRG LVL3 (GOWN DISPOSABLE) ×2 IMPLANT
GOWN STRL REUS W/TWL LRG LVL3 (GOWN DISPOSABLE) ×4
KIT RM TURNOVER STRD PROC AR (KITS) ×3 IMPLANT
LABEL OR SOLS (LABEL) ×3 IMPLANT
NEEDLE HYPO 25X1 1.5 SAFETY (NEEDLE) ×3 IMPLANT
NS IRRIG 500ML POUR BTL (IV SOLUTION) ×3 IMPLANT
PACK BASIN MINOR ARMC (MISCELLANEOUS) ×3 IMPLANT
SUT CHROMIC 3 0 SH 27 (SUTURE) ×3 IMPLANT
SUT MNCRL 3-0 UNDYED SH (SUTURE) ×1 IMPLANT
SUT MNCRL 4-0 (SUTURE) ×2
SUT MNCRL 4-0 27XMFL (SUTURE) ×1
SUT MONOCRYL 3-0 UNDYED (SUTURE) ×2
SUT VIC AB 4-0 SH 27 (SUTURE) ×2
SUT VIC AB 4-0 SH 27XANBCTRL (SUTURE) ×1 IMPLANT
SUTURE MNCRL 4-0 27XMF (SUTURE) ×1 IMPLANT
SYR BULB EAR ULCER 3OZ GRN STR (SYRINGE) ×3 IMPLANT
SYRINGE 10CC LL (SYRINGE) ×3 IMPLANT

## 2017-06-13 NOTE — Discharge Instructions (Addendum)
Take Tylenol or Norco if needed for pain.   Should not drive or do anything dangerous when taking Norco.   Gradually resume usual activities as tolerated.   May shower and blot dry. The glue will likely come off within the next 1 or 2 weeks.    AMBULATORY SURGERY  DISCHARGE INSTRUCTIONS   1) The drugs that you were given will stay in your system until tomorrow so for the next 24 hours you should not:  A) Drive an automobile B) Make any legal decisions C) Drink any alcoholic beverage   2) You may resume regular meals tomorrow.  Today it is better to start with liquids and gradually work up to solid foods.  You may eat anything you prefer, but it is better to start with liquids, then soup and crackers, and gradually work up to solid foods.   3) Please notify your doctor immediately if you have any unusual bleeding, trouble breathing, redness and pain at the surgery site, drainage, fever, or pain not relieved by medication.    4) Additional Instructions: TAKE A STOOL SOFTENER TWICE A DAY WHILE TAKING NARCOTIC PAIN MEDICINE TO PREVENT CONSTIPATION   Please contact your physician with any problems or Same Day Surgery at [phone], Monday through Friday 6 am to 4 pm, or [place] at [facility] number at [phone].        CIRUGIA AMBULATORIA       Instruccionnes de alta   1.  Las drogas que se Statistician en su cuerpo [employer], asi      que por las proximas 24 horas usted no debe:   Conducir Scientist, research (medical)) un automovil   Hacer ninguna decision legal   Tomar ninguna bebida alcoholica  2.  A) [place]ana puede comenzar una dieta regular.  Es mejor que hoy empiece con                    liquidos y gradualmente anada comidas solidas.       B) Puede comer cualquier comida que desee pero es mejor empezar con liquidos,               luego sopitas con galletas saladas y gradualmente llegar a las comidas solidas.  3.  Por favor avise a su medico  inmediatamente si usted tiene algun sangrado anormal,       tiene dificultad con la respiracion, enrojecimiento y Social research officer, government en el sitio de la cirugia,     [place], fiebro o dolor que se alivia con [place].     4.  Istrucciones especificas :

## 2017-06-13 NOTE — Transfer of Care (Signed)
Immediate Anesthesia Transfer of Care Note  Patient: Sheryl Medina  Procedure(s) Performed: Procedure(s): EXCISION LIPOMA ON BACK (N/A)  Patient Location: PACU  Anesthesia Type:MAC  Level of Consciousness: awake, alert , oriented and patient cooperative  Airway & Oxygen Therapy: Patient Spontanous Breathing  Post-op Assessment: Report given to RN, Post -op Vital signs reviewed and stable and Patient moving all extremities  Post vital signs: Reviewed and stable  Last Vitals:  Vitals:   06/13/17 0644 06/13/17 0847  BP: 122/88 129/79  Pulse: 73 82  Resp: 16 20  Temp: 36.7 C 37 C  SpO2:  93%    Last Pain:  Vitals:   06/13/17 0847  PainSc: 0-No pain      Patients Stated Pain Goal: 0 (35/36/14 4315)  Complications: No apparent anesthesia complications

## 2017-06-13 NOTE — OR Nursing (Addendum)
Pt c/o nausea IV med given per [facility]. Peppermint EO on cotton swab given to patient, tolerating well.

## 2017-06-13 NOTE — Anesthesia Post-op Follow-up Note (Signed)
Anesthesia QCDR form completed.        

## 2017-06-13 NOTE — H&P (Signed)
  She comes for excision mass of upper back.  She reports no change in condition since office exam.  Site was marked upper back.  Discussed plan for surgery

## 2017-06-13 NOTE — Anesthesia Postprocedure Evaluation (Signed)
Anesthesia Post Note  Patient: Sheryl Medina  Procedure(s) Performed: Procedure(s) (LRB): EXCISION LIPOMA ON BACK (N/A)  Patient location during evaluation: PACU Anesthesia Type: General Level of consciousness: awake and alert Pain management: pain level controlled Vital Signs Assessment: post-procedure vital signs reviewed and stable Respiratory status: spontaneous breathing, nonlabored ventilation, respiratory function stable and patient connected to nasal cannula oxygen Cardiovascular status: blood pressure returned to baseline and stable Postop Assessment: no signs of nausea or vomiting Anesthetic complications: no     Last Vitals:  Vitals:   06/13/17 0929 06/13/17 1006  BP: 107/76 101/65  Pulse: 72 72  Resp: 14 14  Temp: 36.7 C   SpO2: 94% 94%    Last Pain:  Vitals:   06/13/17 1006  TempSrc:   PainSc: [place]

## 2017-06-13 NOTE — Op Note (Signed)
OPERATIVE REPORT  PREOPERATIVE  DIAGNOSIS: .Marland Kitchen Subcutaneous mass of the upper back  POSTOPERATIVE DIAGNOSIS: .Marland Kitchen Subcutaneous mass of the upper back  PROCEDURE: .Marland Kitchen Excision of subcutaneous mass of upper back  ANESTHESIA:  General  SURGEON: Rochel Brome  MD   INDICATIONS: . She has a history of a mass of the upper back with moderate discomfort which has increased in size over the past year. The mass was palpable during office exam. Excision was recommended for definitive treatment.  With the patient on the operating table in the prone position and sedated and monitored by the anesthesia staff the site of the upper back was prepared with ChloraPrep and draped in a sterile manner  The skin was infiltrated with 1% Xylocaine with epinephrine. A transversely oriented 6 cm incision was made and carried down through subcutaneous tissues. Dissection was carried down through fatty tissue and encountered the deep fascia. At this point it appeared that the mass was transected. It was dissected free from surrounding structures and found to be lobulated and poorly demarcated. The mass was approximately  4.5 cm in dimension and was resected in 2 pieces and was submitted in formalin for routine pathology. One clamped vessel was suture ligated with 3-0 chromic. Several small bleeding points were cauterized. Hemostasis was subsequently intact. The deep layer of dermis was closed with interrupted 3-0 Monocryl inverted sutures. The skin was then closed with a running 4-0 Monocryl subcuticular suture and Dermabond  The patient tolerated surgery satisfactorily and was prepared for transfer to the recovery room  [employer].D.

## 2017-06-13 NOTE — Anesthesia Preprocedure Evaluation (Signed)
Anesthesia Evaluation  Patient identified by MRN, date of birth, ID band Patient awake    Reviewed: Allergy & Precautions, NPO status , Patient's Chart, lab work & pertinent test results, reviewed documented beta blocker date and time   Airway Mallampati: II  TM Distance: >3 FB     Dental  (+) Chipped   Pulmonary           Cardiovascular      Neuro/Psych  Headaches,    GI/Hepatic   Endo/Other  diabetes, Type 2  Renal/GU      Musculoskeletal   Abdominal   Peds  Hematology  (+) anemia ,   Anesthesia Other Findings   Reproductive/Obstetrics                             Anesthesia Physical Anesthesia Plan  ASA: II  Anesthesia Plan: General   Post-op Pain Management:    Induction: Intravenous  PONV Risk Score and Plan:   Airway Management Planned: Oral ETT  Additional Equipment:   Intra-op Plan:   Post-operative Plan:   Informed Consent: I have reviewed the patients History and Physical, chart, labs and discussed the procedure including the risks, benefits and alternatives for the proposed anesthesia with the patient or authorized representative who has indicated his/her understanding and acceptance.     Plan Discussed with: CRNA  Anesthesia Plan Comments:         Anesthesia Quick Evaluation

## 2017-06-13 NOTE — OR Nursing (Signed)
Sheryl Medina, the interpreter has been with patient since arrival in [place]. Dr. Tamala Julian here to speak with patient and family.

## 2017-06-17 LAB — SURGICAL PATHOLOGY

## 2017-06-24 ENCOUNTER — Ambulatory Visit: Payer: PRIVATE HEALTH INSURANCE

## 2018-06-22 ENCOUNTER — Ambulatory Visit: Payer: PRIVATE HEALTH INSURANCE | Admitting: Anesthesiology

## 2018-06-22 ENCOUNTER — Encounter
Admission: RE | Disposition: A | Discharge status: Home / Self Care | Payer: Self-pay | Source: Ambulatory Visit | Attending: Unknown Physician Specialty

## 2018-06-22 ENCOUNTER — Ambulatory Visit
Admission: RE | Admit: 2018-06-22 | Discharge: 2018-06-22 | Disposition: A | Discharge status: Home / Self Care | Payer: PRIVATE HEALTH INSURANCE | Source: Ambulatory Visit | Attending: Unknown Physician Specialty | Admitting: Unknown Physician Specialty

## 2018-06-22 ENCOUNTER — Encounter: Payer: Self-pay | Admitting: *Deleted

## 2018-06-22 DIAGNOSIS — E119 Type 2 diabetes mellitus without complications: Secondary | ICD-10-CM | POA: Insufficient documentation

## 2018-06-22 DIAGNOSIS — Z1211 Encounter for screening for malignant neoplasm of colon: Secondary | ICD-10-CM | POA: Insufficient documentation

## 2018-06-22 DIAGNOSIS — K640 First degree hemorrhoids: Secondary | ICD-10-CM | POA: Diagnosis not present

## 2018-06-22 DIAGNOSIS — K635 Polyp of colon: Secondary | ICD-10-CM | POA: Insufficient documentation

## 2018-06-22 DIAGNOSIS — Z7984 Long term (current) use of oral hypoglycemic drugs: Secondary | ICD-10-CM | POA: Insufficient documentation

## 2018-06-22 DIAGNOSIS — Z79899 Other long term (current) drug therapy: Secondary | ICD-10-CM | POA: Insufficient documentation

## 2018-06-22 HISTORY — PX: COLONOSCOPY WITH PROPOFOL: SHX5780

## 2018-06-22 SURGERY — COLONOSCOPY WITH PROPOFOL
Anesthesia: General

## 2018-06-22 MED ORDER — PROPOFOL 10 MG/ML IV BOLUS
INTRAVENOUS | Status: DC | PRN
  Administered 2018-06-22 (×2): 20 mg via INTRAVENOUS

## 2018-06-22 MED ORDER — LIDOCAINE HCL (PF) 2 % IJ SOLN
INTRAMUSCULAR | Status: DC | PRN
  Administered 2018-06-22: 60 mg

## 2018-06-22 MED ORDER — MIDAZOLAM HCL 5 MG/5ML IJ SOLN
INTRAMUSCULAR | Status: DC | PRN
  Administered 2018-06-22: 2 mg via INTRAVENOUS

## 2018-06-22 MED ORDER — PROPOFOL 500 MG/50ML IV EMUL
INTRAVENOUS | Status: DC | PRN
  Administered 2018-06-22: 50 ug/kg/min via INTRAVENOUS

## 2018-06-22 MED ORDER — SODIUM CHLORIDE 0.9 % IV SOLN: INTRAVENOUS | Status: DC

## 2018-06-22 MED ORDER — MIDAZOLAM HCL 2 MG/2ML IJ SOLN
INTRAMUSCULAR | Status: AC
  Filled 2018-06-22: qty 2

## 2018-06-22 MED ORDER — PROPOFOL 500 MG/50ML IV EMUL
INTRAVENOUS | Status: AC
  Filled 2018-06-22: qty 50

## 2018-06-22 MED ORDER — FENTANYL CITRATE (PF) 100 MCG/2ML IJ SOLN
INTRAMUSCULAR | Status: AC
  Filled 2018-06-22: qty 2

## 2018-06-22 MED ORDER — LIDOCAINE HCL (PF) 2 % IJ SOLN
INTRAMUSCULAR | Status: AC
  Filled 2018-06-22: qty 10

## 2018-06-22 MED ORDER — ONDANSETRON HCL 4 MG/2ML IJ SOLN: 4.0000 mg | Freq: Once | INTRAMUSCULAR | Status: DC | PRN

## 2018-06-22 MED ORDER — SODIUM CHLORIDE 0.9 % IV SOLN
INTRAVENOUS | Status: DC
  Administered 2018-06-22: 1000 mL via INTRAVENOUS

## 2018-06-22 MED ORDER — FENTANYL CITRATE (PF) 100 MCG/2ML IJ SOLN: 25.0000 ug | INTRAMUSCULAR | Status: DC | PRN

## 2018-06-22 MED ORDER — FENTANYL CITRATE (PF) 100 MCG/2ML IJ SOLN
INTRAMUSCULAR | Status: DC | PRN
  Administered 2018-06-22 (×2): 50 ug via INTRAVENOUS

## 2018-06-22 NOTE — Anesthesia Preprocedure Evaluation (Signed)
Anesthesia Evaluation  Patient identified by MRN, date of birth, ID band Patient awake    Reviewed: Allergy & Precautions, H&P , NPO status , Patient's Chart, lab work & pertinent test results, reviewed documented beta blocker date and time   Airway Mallampati: II   Neck ROM: full    Dental  (+) Poor Dentition   Pulmonary neg pulmonary ROS,    Pulmonary exam normal        Cardiovascular Exercise Tolerance: Good negative cardio ROS Normal cardiovascular exam Rhythm:regular Rate:Normal     Neuro/Psych  Headaches, negative neurological ROS  negative psych ROS   GI/Hepatic negative GI ROS, Neg liver ROS,   Endo/Other  negative endocrine ROSdiabetes  Renal/GU negative Renal ROS  negative genitourinary   Musculoskeletal   Abdominal   Peds  Hematology negative hematology ROS (+) anemia ,   Anesthesia Other Findings Past Medical History: No date: Anemia     Comment:  in past No date: Diabetes mellitus without complication (HCC) No date: Headache     Comment:  migraines Past Surgical History: No date: CHOLECYSTECTOMY 2012: EYELID LACERATION REPAIR; Right     Comment:  small cyst removed 06/13/2017: LIPOMA EXCISION; N/A     Comment:  Procedure: EXCISION LIPOMA ON BACK;  Surgeon: Leonie Green, MD;  Location: [facility] ORS;  Service:               General;  Laterality: N/A; No date: TUBAL LIGATION   Reproductive/Obstetrics negative OB ROS                             Anesthesia Physical Anesthesia Plan  ASA: II  Anesthesia Plan: General   Post-op Pain Management:    Induction:   PONV Risk Score and Plan:   Airway Management Planned:   Additional Equipment:   Intra-op Plan:   Post-operative Plan:   Informed Consent: I have reviewed the patients History and Physical, chart, labs and discussed the procedure including the risks, benefits and alternatives for the  proposed anesthesia with the patient or authorized representative who has indicated his/her understanding and acceptance.   Dental Advisory Given  Plan Discussed with: CRNA  Anesthesia Plan Comments:         Anesthesia Quick Evaluation

## 2018-06-22 NOTE — Anesthesia Postprocedure Evaluation (Signed)
Anesthesia Post Note  Patient: Sheryl Medina  Procedure(s) Performed: COLONOSCOPY WITH PROPOFOL (N/A )  Patient location during evaluation: PACU Anesthesia Type: General Level of consciousness: awake and alert Pain management: pain level controlled Vital Signs Assessment: post-procedure vital signs reviewed and stable Respiratory status: spontaneous breathing, nonlabored ventilation, respiratory function stable and patient connected to nasal cannula oxygen Cardiovascular status: blood pressure returned to baseline and stable Postop Assessment: no apparent nausea or vomiting Anesthetic complications: no     Last Vitals:  Vitals:   06/22/18 1152 06/22/18 1202  BP: 110/78 112/77  Pulse: 60 69  Resp: 14 17  Temp:    SpO2: 100% 97%    Last Pain:  Vitals:   06/22/18 1202  TempSrc:   PainSc: 0-No pain                 Molli Barrows

## 2018-06-22 NOTE — Anesthesia Post-op Follow-up Note (Signed)
Anesthesia QCDR form completed.        

## 2018-06-22 NOTE — OR Nursing (Signed)
Hot Snare Ascending Colon Polyp - Not Retrived

## 2018-06-22 NOTE — H&P (Signed)
Primary Care Physician:  Glendon Axe, MD Primary Gastroenterologist:  Dr. Vira Agar  Pre-Procedure History & Physical: HPI:  Sheryl Medina is a 52 y.o. female is here for an colonoscopy.  This is for screening colonoscopy, she has never had one before.   Past Medical History:  Diagnosis Date  . Anemia    in past  . Diabetes mellitus without complication ([place])   . Headache    migraines    Past Surgical History:  Procedure Laterality Date  . CHOLECYSTECTOMY    . EYELID LACERATION REPAIR Right 2012   small cyst removed  . LIPOMA EXCISION N/A 06/13/2017   Procedure: EXCISION LIPOMA ON BACK;  Surgeon: Leonie Green, MD;  Location: [facility] ORS;  Service: General;  Laterality: N/A;  . TUBAL LIGATION      Prior to Admission medications   Medication Sig Start Date End Date Taking? Authorizing Provider  glimepiride (AMARYL) 4 MG tablet Take 4 mg by mouth daily with breakfast.   Yes [provider]  naproxen sodium (ALEVE) 220 MG tablet Take 220 mg by mouth 2 (two) times daily with a meal.   Yes [provider]  pantoprazole (PROTONIX) 40 MG tablet Take 40 mg by mouth daily.   Yes [provider]  HYDROcodone-acetaminophen (NORCO) 5-325 MG tablet Take 1-2 tablets by mouth every 4 (four) hours as needed for moderate pain. 06/13/17   Leonie Green, MD  HYDROcodone-acetaminophen (NORCO/VICODIN) 5-325 MG per tablet Take 1 tablet by mouth every 4 (four) hours as needed for moderate pain (4-6/10).    [provider]  meloxicam (MOBIC) 7.5 MG tablet Take 7.5 mg by mouth daily.    [provider]  metFORMIN (GLUCOPHAGE) 1000 MG tablet Take 1,000 mg by mouth 2 (two) times daily with a meal.    [provider]    Allergies as of 04/13/2018  . (No Known Allergies)    History reviewed. No pertinent family history.  Social History   Socioeconomic History  . Marital status: Married    Spouse name: Not on file  .  Number of children: Not on file  . Years of education: Not on file  . Highest education level: Not on file  Occupational History  . Not on file  Social Needs  . Financial resource strain: Not on file  . Food insecurity:    Worry: Not on file    Inability: Not on file  . Transportation needs:    Medical: Not on file    Non-medical: Not on file  Tobacco Use  . Smoking status: Never Smoker  . Smokeless tobacco: Never Used  Substance and Sexual Activity  . Alcohol use: No  . Drug use: No  . Sexual activity: Yes    Birth control/protection: Post-menopausal  Lifestyle  . Physical activity:    Days per week: Not on file    Minutes per session: Not on file  . Stress: Not on file  Relationships  . Social connections:    Talks on phone: Not on file    Gets together: Not on file    Attends religious service: Not on file    Active member of club or organization: Not on file    Attends meetings of clubs or organizations: Not on file    Relationship status: Not on file  . Intimate partner violence:    Fear of current or ex partner: Not on file    Emotionally abused: Not on file    Physically  abused: Not on file    Forced sexual activity: Not on file  Other Topics Concern  . Not on file  Social History Narrative  . Not on file    Review of Systems: See HPI, otherwise negative ROS  Physical Exam: BP 110/82   Pulse 66   Temp (!) 96.7 F (35.9 C) (Tympanic)   Resp 18   Ht 5' (1.524 m)   Wt 66.2 kg   SpO2 99%   BMI 28.50 kg/m  General:   Alert,  pleasant and cooperative in NAD Head:  Normocephalic and atraumatic. Neck:  Supple; no masses or thyromegaly. Lungs:  Clear throughout to auscultation.    Heart:  Regular rate and rhythm. Abdomen:  Soft, nontender and nondistended. Normal bowel sounds, without guarding, and without rebound.   Neurologic:  Alert and  oriented x4;  grossly normal neurologically.  Impression/Plan: Sheryl Medina is here for an  colonoscopy to be performed for screening colonoscopy.  Risks, benefits, limitations, and alternatives regarding  colonoscopy have been reviewed with the patient.  Questions have been answered.  All parties agreeable.   Gaylyn Cheers, MD  06/22/2018, 10:56 AM

## 2018-06-22 NOTE — Transfer of Care (Signed)
Immediate Anesthesia Transfer of Care Note  Patient: Sheryl Medina  Procedure(s) Performed: COLONOSCOPY WITH PROPOFOL (N/A )  Patient Location: PACU  Anesthesia Type:General  Level of Consciousness: sedated  Airway & Oxygen Therapy: Patient Spontanous Breathing and Patient connected to nasal cannula oxygen  Post-op Assessment: Report given to RN and Post -op Vital signs reviewed and stable  Post vital signs: Reviewed and stable  Last Vitals:  Vitals Value Taken Time  BP 92/66 06/22/2018 11:32 AM  Temp    Pulse 68 06/22/2018 11:32 AM  Resp 16 06/22/2018 11:32 AM  SpO2 99 % 06/22/2018 11:32 AM  Vitals shown include unvalidated device data.  Last Pain:  Vitals:   06/22/18 1030  TempSrc: Tympanic  PainSc: 0-No pain         Complications: No apparent anesthesia complications

## 2018-06-22 NOTE — Op Note (Signed)
[facility] Gastroenterology Patient Name: Sheryl Medina Procedure Date: 06/22/2018 10:48 AM MRN: [id number] Account #: [id number] Date of Birth: 05-08-1966 Admit Type: Outpatient Age: 52 Room: [facility] ENDO ROOM 1 Gender: Female Note Status: Finalized Procedure:            Colonoscopy Indications:          Screening for colorectal malignant neoplasm Providers:            Manya Silvas, MD Referring MD:         Glendon Axe (Referring MD) Medicines:            Propofol per Anesthesia Complications:        No immediate complications. Procedure:            Pre-Anesthesia Assessment:                       - After reviewing the risks and benefits, the patient                        was deemed in satisfactory condition to undergo the                        procedure.                       After obtaining informed consent, the colonoscope was                        passed under direct vision. Throughout the procedure,                        the patient's blood pressure, pulse, and oxygen                        saturations were monitored continuously. The                        Colonoscope was introduced through the anus and                        advanced to the the cecum, identified by appendiceal                        orifice and ileocecal valve. The colonoscopy was                        performed without difficulty. The patient tolerated the                        procedure well. The quality of the bowel preparation                        was good. Findings:      A diminutive polyp was found in the ascending colon. The polyp was       sessile. The polyp was removed with a hot snare. Resection was complete,       but the polyp tissue was not retrieved. It was destroyed in the process,       being very small.      Internal hemorrhoids were found during endoscopy. The hemorrhoids were  small and Grade I (internal hemorrhoids that do not  prolapse).      The exam was otherwise without abnormality. Impression:           - One diminutive polyp in the ascending colon, removed                        with a hot snare. Complete resection. Polyp tissue not                        retrieved.                       - Internal hemorrhoids.                       - The examination was otherwise normal. Recommendation:       - The findings and recommendations were discussed with                        the patient's family. Manya Silvas, MD 06/22/2018 11:31:27 AM This report has been signed electronically. Number of Addenda: 0 Note Initiated On: 06/22/2018 10:48 AM Scope Withdrawal Time: 0 hours 13 minutes 17 seconds  Total Procedure Duration: 0 hours 21 minutes 2 seconds       [facility]

## 2018-06-23 ENCOUNTER — Encounter: Payer: Self-pay | Admitting: Unknown Physician Specialty

## 2018-08-24 ENCOUNTER — Other Ambulatory Visit: Payer: Self-pay | Admitting: Internal Medicine

## 2018-08-24 DIAGNOSIS — Z1231 Encounter for screening mammogram for malignant neoplasm of breast: Secondary | ICD-10-CM

## 2018-09-22 ENCOUNTER — Ambulatory Visit
Admission: RE | Admit: 2018-09-22 | Discharge: 2018-09-22 | Disposition: A | Discharge status: Home / Self Care | Payer: Self-pay | Source: Ambulatory Visit | Attending: Internal Medicine | Admitting: Internal Medicine

## 2018-09-22 DIAGNOSIS — Z1231 Encounter for screening mammogram for malignant neoplasm of breast: Secondary | ICD-10-CM | POA: Insufficient documentation

## 2019-09-23 ENCOUNTER — Other Ambulatory Visit: Payer: Self-pay | Admitting: Internal Medicine

## 2019-09-23 DIAGNOSIS — Z1231 Encounter for screening mammogram for malignant neoplasm of breast: Secondary | ICD-10-CM

## 2019-10-05 ENCOUNTER — Ambulatory Visit
Admission: RE | Admit: 2019-10-05 | Discharge: 2019-10-05 | Disposition: A | Discharge status: Home / Self Care | Payer: BLUE CROSS/BLUE SHIELD | Source: Ambulatory Visit | Attending: Internal Medicine | Admitting: Internal Medicine

## 2019-10-05 DIAGNOSIS — Z1231 Encounter for screening mammogram for malignant neoplasm of breast: Secondary | ICD-10-CM | POA: Diagnosis not present

## 2020-01-08 ENCOUNTER — Ambulatory Visit: Payer: BLUE CROSS/BLUE SHIELD | Attending: Internal Medicine

## 2020-01-08 DIAGNOSIS — Z23 Encounter for immunization: Secondary | ICD-10-CM

## 2020-01-08 NOTE — Progress Notes (Signed)
   Covid-19 Vaccination Clinic  Name:  Sheryl Medina    MRN: [id number] DOB: September 05, 1966  01/08/2020  Ms. Aug was observed post Covid-19 immunization for 15 minutes without incident. She was provided with Vaccine Information Sheet and instruction to access the V-Safe system.   Ms. Bigness was instructed to call 911 with any severe reactions post vaccine: Marland Kitchen Difficulty breathing  . Swelling of face and throat  . A fast heartbeat  . A bad rash all over body  . Dizziness and weakness   Immunizations Administered    Name Date Dose VIS Date Route   Pfizer COVID-19 Vaccine 01/08/2020 10:33 AM 0.3 mL 09/24/2019 Intramuscular   Manufacturer: [place]   Lot: [id number]   [place]: [id number]

## 2020-01-29 ENCOUNTER — Ambulatory Visit: Payer: BLUE CROSS/BLUE SHIELD | Attending: Internal Medicine

## 2020-01-29 DIAGNOSIS — Z23 Encounter for immunization: Secondary | ICD-10-CM

## 2020-01-29 NOTE — Progress Notes (Signed)
   Covid-19 Vaccination Clinic  Name:  Sheryl Medina    MRN: [id number] DOB: July 07, 1966  01/29/2020  [name] was observed post Covid-19 immunization for 15 minutes without incident. She was provided with Vaccine Information Sheet and instruction to access the V-Safe system.   [name] was instructed to call 911 with any severe reactions post vaccine: Marland Kitchen Difficulty breathing  . Swelling of face and throat  . A fast heartbeat  . A bad rash all over body  . Dizziness and weakness   Immunizations Administered    Name Date Dose VIS Date Route   Pfizer COVID-19 Vaccine 01/29/2020 10:27 AM 0.3 mL 09/24/2019 Intramuscular   Manufacturer: [place]   Lot: [id number]   [place]: [id number]

## 2021-01-18 ENCOUNTER — Other Ambulatory Visit: Payer: Self-pay | Admitting: Internal Medicine

## 2021-01-18 DIAGNOSIS — Z1231 Encounter for screening mammogram for malignant neoplasm of breast: Secondary | ICD-10-CM

## 2021-01-31 ENCOUNTER — Ambulatory Visit
Admission: RE | Admit: 2021-01-31 | Discharge: 2021-01-31 | Disposition: A | Discharge status: Home / Self Care | Payer: BLUE CROSS/BLUE SHIELD | Source: Ambulatory Visit | Attending: Internal Medicine | Admitting: Internal Medicine

## 2021-01-31 ENCOUNTER — Other Ambulatory Visit: Payer: Self-pay

## 2021-01-31 DIAGNOSIS — Z1231 Encounter for screening mammogram for malignant neoplasm of breast: Secondary | ICD-10-CM | POA: Diagnosis present

## 2021-05-23 ENCOUNTER — Other Ambulatory Visit: Payer: Self-pay | Admitting: Internal Medicine

## 2021-05-23 DIAGNOSIS — N644 Mastodynia: Secondary | ICD-10-CM

## 2022-02-28 ENCOUNTER — Other Ambulatory Visit: Payer: Self-pay | Admitting: Internal Medicine

## 2022-02-28 DIAGNOSIS — Z1231 Encounter for screening mammogram for malignant neoplasm of breast: Secondary | ICD-10-CM

## 2022-06-09 ENCOUNTER — Ambulatory Visit
Admission: EM | Admit: 2022-06-09 | Discharge: 2022-06-09 | Disposition: A | Discharge status: Home / Self Care | Payer: BLUE CROSS/BLUE SHIELD | Attending: Emergency Medicine | Admitting: Emergency Medicine

## 2022-06-09 ENCOUNTER — Encounter: Payer: Self-pay | Admitting: Emergency Medicine

## 2022-06-09 DIAGNOSIS — H01004 Unspecified blepharitis left upper eyelid: Secondary | ICD-10-CM | POA: Diagnosis not present

## 2022-06-09 MED ORDER — ERYTHROMYCIN 5 MG/GM OP OINT: TOPICAL_OINTMENT | OPHTHALMIC | 1 refills | Status: DC

## 2022-06-09 NOTE — Discharge Instructions (Signed)
Perform eyelid hygiene with baby shampoo worked into a Scientist, physiological. Scrub your eyelids 10 times and rinse thoroughly before each application of antibiotic ointment.  Apply warm compresses to your eye 2-3 times a day to help resolve the infection.  Apply a thin, 1 inch ribbon to your inner eyelid margin 3 times a day for 2 weeks, followed by once daily at bedtime daily for 2 weeks.  If your symptoms do not improve you need to see an eye doctor.

## 2022-06-09 NOTE — ED Triage Notes (Signed)
Patient c/o redness and swelling in her upper left eyelid that started on Thursday.  Patient states that she also has a stye forming on her upper right eye lid. Patient denies injury or fall.

## 2022-06-09 NOTE — ED Provider Notes (Signed)
[facility]    CSN: [id number] Arrival date & time: 06/09/22  1148      History   Chief Complaint Chief Complaint  Patient presents with   Eye Problem    left    HPI Sheryl Medina is a 56 y.o. female.   HPI  56 year old female here for evaluation of eye complaint.  Patient reports that she has been experiencing swelling and redness to her left upper eyelid for the past 3 days.  This morning she had some slight white discharge in the lateral corner of her eye.  She denies any itching, fever, or changes in vision.  She recently was treated for a stye in her right eye and she has been using erythromycin ointment without any improvement of symptoms.  Past Medical History:  Diagnosis Date   Anemia    in past   Diabetes mellitus without complication ([place])    Headache    migraines    Patient Active Problem List   Diagnosis Date Noted   Hydrops of gallbladder 02/11/2015    Past Surgical History:  Procedure Laterality Date   CHOLECYSTECTOMY     COLONOSCOPY WITH PROPOFOL N/A 06/22/2018   Procedure: COLONOSCOPY WITH PROPOFOL;  Surgeon: Manya Silvas, MD;  Location: [facility] ENDOSCOPY;  Service: Endoscopy;  Laterality: N/A;   EYELID LACERATION REPAIR Right 2012   small cyst removed   LIPOMA EXCISION N/A 06/13/2017   Procedure: EXCISION LIPOMA ON BACK;  Surgeon: Leonie Green, MD;  Location: [facility] ORS;  Service: General;  Laterality: N/A;   TUBAL LIGATION      OB History   No obstetric history on file.      Home Medications    Prior to Admission medications   Medication Sig Start Date End Date Taking? Authorizing Provider  erythromycin ophthalmic ointment Place a 1/2 inch ribbon of ointment into the lower eyelid 3 x daily for 2 weeks, followed by once daily at bedtime for 2 weeks. 06/09/22  Yes Margarette Canada, NP  glimepiride (AMARYL) 4 MG tablet Take 4 mg by mouth daily with breakfast.    [provider]   HYDROcodone-acetaminophen (NORCO) 5-325 MG tablet Take 1-2 tablets by mouth every 4 (four) hours as needed for moderate pain. 06/13/17   Leonie Green, MD  HYDROcodone-acetaminophen (NORCO/VICODIN) 5-325 MG per tablet Take 1 tablet by mouth every 4 (four) hours as needed for moderate pain (4-6/10).    [provider]  meloxicam (MOBIC) 7.5 MG tablet Take 7.5 mg by mouth daily.    [provider]  metFORMIN (GLUCOPHAGE) 1000 MG tablet Take 1,000 mg by mouth 2 (two) times daily with a meal.    [provider]  naproxen sodium (ALEVE) 220 MG tablet Take 220 mg by mouth 2 (two) times daily with a meal.    [provider]  pantoprazole (PROTONIX) 40 MG tablet Take 40 mg by mouth daily.    [provider]    Family History History reviewed. No pertinent family history.  Social History Social History   Tobacco Use   Smoking status: Never   Smokeless tobacco: Never  Vaping Use   Vaping Use: Never used  Substance Use Topics   Alcohol use: No   Drug use: No     Allergies   Patient has no known allergies.   Review of Systems Review of Systems  Constitutional:  Negative for fever.  Eyes:  Positive for discharge and redness. Negative for photophobia, pain, itching and visual  disturbance.     Physical Exam Triage Vital Signs ED Triage Vitals [06/09/22 1203]  Enc Vitals Group     BP      Pulse      Resp      Temp      Temp src      SpO2      Weight      Height      Head Circumference      Peak Flow      Pain Score 8     Pain Loc      Pain Edu?      Excl. in [place]?    No data found.  Updated Vital Signs BP 124/75 (BP Location: Right Arm)   Pulse 78   Temp 98.8 F (37.1 C) (Oral)   Resp 14   Ht 5' (1.524 m)   Wt 145 lb 15.1 oz (66.2 kg)   SpO2 97%   BMI 28.50 kg/m   Visual Acuity Right Eye Distance:   Left Eye Distance:   Bilateral Distance:    Right Eye Near:   Left Eye Near:    Bilateral Near:      Physical Exam Vitals and nursing note reviewed.  Constitutional:      Appearance: Normal appearance. She is not ill-appearing.  HENT:     Head: Normocephalic and atraumatic.  Eyes:     General: No scleral icterus.       Left eye: No discharge.     Extraocular Movements: Extraocular movements intact.     Conjunctiva/sclera: Conjunctivae normal.     Pupils: Pupils are equal, round, and reactive to light.  Skin:    General: Skin is warm and dry.     Capillary Refill: Capillary refill takes less than 2 seconds.     Findings: Erythema present.  Neurological:     General: No focal deficit present.     Mental Status: She is alert and oriented to person, place, and time.  Psychiatric:        Mood and Affect: Mood normal.        Behavior: Behavior normal.        Thought Content: Thought content normal.        Judgment: Judgment normal.      UC Treatments / Results  Labs (all labs ordered are listed, but only abnormal results are displayed) Labs Reviewed - No data to display  EKG   Radiology No results found.  Procedures Procedures (including critical care time)  Medications Ordered in UC Medications - No data to display  Initial Impression / Assessment and Plan / UC Course  I have reviewed the triage vital signs and the nursing notes.  Pertinent labs & imaging results that were available during my care of the patient were reviewed by me and considered in my medical decision making (see chart for details).   Patient is a very pleasant, nontoxic-appearing 56 year old female here for evaluation of redness and swelling to left upper eyelid that is been present for the last 3 days.  This is not associated with any changes in vision, itching, or fever.  Patient does report that she has some white discharge in the lateral canthus of her eye this morning but she has not had discharge for last 3 days.  On exam patient does have erythema and edema to the left upper eyelid.  It is  tender to touch but there is no fluctuance or induration.  Patient's pupils equal round and  reactive and she has normal red light reflex in her left eye.  Her EOMs intact.  Bulbar and labral conjunctiva are unremarkable.  I did evert the edge of the upper eyelid to look for the presence of any foreign body or hordeolum formation I did not find evidence of either 1.  Patient's exam is consistent with blepharitis.  I have prescribed the patient erythromycin ointment 3 times a day for the next 2 weeks followed by once daily at bedtime for an additional 2 weeks.  I have also suggested she use warm compresses to help resolve the infection and perform eyelid hygiene with baby shampoo worked into a Scientist, physiological before each instillation of antibiotics.  If her symptoms do not improve she should follow-up with an eye doctor.  Patient verbalizes understanding of same.   Final Clinical Impressions(s) / UC Diagnoses   Final diagnoses:  Blepharitis of left upper eyelid, unspecified type     Discharge Instructions      Perform eyelid hygiene with baby shampoo worked into a Scientist, physiological. Scrub your eyelids 10 times and rinse thoroughly before each application of antibiotic ointment.  Apply warm compresses to your eye 2-3 times a day to help resolve the infection.  Apply a thin, 1 inch ribbon to your inner eyelid margin 3 times a day for 2 weeks, followed by once daily at bedtime daily for 2 weeks.  If your symptoms do not improve you need to see an eye doctor.       ED Prescriptions     Medication Sig Dispense Auth. Provider   erythromycin ophthalmic ointment Place a 1/2 inch ribbon of ointment into the lower eyelid 3 x daily for 2 weeks, followed by once daily at bedtime for 2 weeks. 3.5 g Margarette Canada, NP      PDMP not reviewed this encounter.   Margarette Canada, NP 06/09/22 1235

## 2022-06-11 ENCOUNTER — Telehealth: Payer: Self-pay | Admitting: Emergency Medicine

## 2022-06-11 MED ORDER — ERYTHROMYCIN 5 MG/GM OP OINT: TOPICAL_OINTMENT | OPHTHALMIC | 1 refills | Status: DC

## 2022-06-11 MED ORDER — ERYTHROMYCIN 5 MG/GM OP OINT: TOPICAL_OINTMENT | OPHTHALMIC | 1 refills | Status: AC

## 2022-06-11 NOTE — Addendum Note (Signed)
Addended by: Ermalinda Barrios on: 06/11/2022 04:05 PM   Modules accepted: Orders

## 2022-06-11 NOTE — Telephone Encounter (Signed)
Pharmacy did not receive medication. Medication resent to pharmacy.

## 2022-06-25 ENCOUNTER — Ambulatory Visit
Admission: RE | Admit: 2022-06-25 | Discharge: 2022-06-25 | Disposition: A | Discharge status: Home / Self Care | Payer: BLUE CROSS/BLUE SHIELD | Source: Ambulatory Visit | Attending: Internal Medicine | Admitting: Internal Medicine

## 2022-06-25 DIAGNOSIS — Z1231 Encounter for screening mammogram for malignant neoplasm of breast: Secondary | ICD-10-CM | POA: Diagnosis present
